# Patient Record
Sex: Female | Born: 1950
Health system: Southern US, Community
[De-identification: ages and names within clinical notes are randomized; demographics above are authoritative.]

## PROBLEM LIST (undated history)

## (undated) HISTORY — PX: ABDOMINAL HYSTERECTOMY: SHX81

## (undated) HISTORY — PX: TUBAL LIGATION: SHX77

---

## 1998-01-03 ENCOUNTER — Other Ambulatory Visit: Admission: RE | Admit: 1998-01-03 | Discharge: 1998-01-03 | Payer: Self-pay | Admitting: *Deleted

## 1998-02-20 ENCOUNTER — Other Ambulatory Visit: Admission: RE | Admit: 1998-02-20 | Discharge: 1998-02-20 | Payer: Self-pay | Admitting: *Deleted

## 1998-04-09 ENCOUNTER — Inpatient Hospital Stay (HOSPITAL_COMMUNITY): Admission: RE | Admit: 1998-04-09 | Discharge: 1998-04-10 | Payer: Self-pay | Admitting: *Deleted

## 1999-01-07 ENCOUNTER — Other Ambulatory Visit: Admission: RE | Admit: 1999-01-07 | Discharge: 1999-01-07 | Payer: Self-pay | Admitting: *Deleted

## 2000-04-15 ENCOUNTER — Other Ambulatory Visit: Admission: RE | Admit: 2000-04-15 | Discharge: 2000-04-15 | Payer: Self-pay | Admitting: *Deleted

## 2002-05-22 ENCOUNTER — Other Ambulatory Visit: Admission: RE | Admit: 2002-05-22 | Discharge: 2002-05-22 | Payer: Self-pay | Admitting: *Deleted

## 2007-01-19 ENCOUNTER — Encounter: Admission: RE | Admit: 2007-01-19 | Discharge: 2007-01-19 | Payer: Self-pay | Admitting: Internal Medicine

## 2008-02-08 ENCOUNTER — Encounter: Admission: RE | Admit: 2008-02-08 | Discharge: 2008-02-08 | Payer: Self-pay | Admitting: Internal Medicine

## 2009-02-25 ENCOUNTER — Encounter: Admission: RE | Admit: 2009-02-25 | Discharge: 2009-02-25 | Payer: Self-pay | Admitting: Internal Medicine

## 2010-03-09 ENCOUNTER — Encounter: Admission: RE | Admit: 2010-03-09 | Discharge: 2010-03-09 | Payer: Self-pay | Admitting: Internal Medicine

## 2011-02-02 ENCOUNTER — Other Ambulatory Visit: Payer: Self-pay | Admitting: Internal Medicine

## 2011-02-02 DIAGNOSIS — Z1231 Encounter for screening mammogram for malignant neoplasm of breast: Secondary | ICD-10-CM

## 2011-03-18 ENCOUNTER — Ambulatory Visit
Admission: RE | Admit: 2011-03-18 | Discharge: 2011-03-18 | Disposition: A | Discharge status: Home / Self Care | Payer: BC Managed Care – PPO | Source: Ambulatory Visit | Attending: Internal Medicine | Admitting: Internal Medicine

## 2011-03-18 DIAGNOSIS — Z1231 Encounter for screening mammogram for malignant neoplasm of breast: Secondary | ICD-10-CM

## 2012-02-09 ENCOUNTER — Other Ambulatory Visit: Payer: Self-pay | Admitting: Internal Medicine

## 2012-02-09 DIAGNOSIS — Z1231 Encounter for screening mammogram for malignant neoplasm of breast: Secondary | ICD-10-CM

## 2012-03-29 ENCOUNTER — Ambulatory Visit
Admission: RE | Admit: 2012-03-29 | Discharge: 2012-03-29 | Disposition: A | Discharge status: Home / Self Care | Payer: BC Managed Care – PPO | Source: Ambulatory Visit | Attending: Internal Medicine | Admitting: Internal Medicine

## 2012-03-29 DIAGNOSIS — Z1231 Encounter for screening mammogram for malignant neoplasm of breast: Secondary | ICD-10-CM

## 2012-12-18 ENCOUNTER — Other Ambulatory Visit: Payer: Self-pay | Admitting: Gastroenterology

## 2012-12-19 ENCOUNTER — Encounter (HOSPITAL_COMMUNITY): Payer: Self-pay | Admitting: *Deleted

## 2012-12-19 ENCOUNTER — Encounter (HOSPITAL_COMMUNITY): Payer: Self-pay | Admitting: Pharmacy Technician

## 2013-01-09 ENCOUNTER — Encounter (HOSPITAL_COMMUNITY): Payer: Self-pay | Admitting: Anesthesiology

## 2013-01-09 ENCOUNTER — Ambulatory Visit (HOSPITAL_COMMUNITY)
Admission: RE | Admit: 2013-01-09 | Discharge: 2013-01-09 | Disposition: A | Discharge status: Home / Self Care | Payer: BC Managed Care – PPO | Source: Ambulatory Visit | Attending: Gastroenterology | Admitting: Gastroenterology

## 2013-01-09 ENCOUNTER — Encounter (HOSPITAL_COMMUNITY)
Admission: RE | Disposition: A | Discharge status: Home / Self Care | Payer: Self-pay | Source: Ambulatory Visit | Attending: Gastroenterology

## 2013-01-09 ENCOUNTER — Ambulatory Visit (HOSPITAL_COMMUNITY): Payer: BC Managed Care – PPO | Admitting: Anesthesiology

## 2013-01-09 DIAGNOSIS — Z1211 Encounter for screening for malignant neoplasm of colon: Secondary | ICD-10-CM | POA: Insufficient documentation

## 2013-01-09 DIAGNOSIS — Z90710 Acquired absence of both cervix and uterus: Secondary | ICD-10-CM | POA: Insufficient documentation

## 2013-01-09 DIAGNOSIS — Z800 Family history of malignant neoplasm of digestive organs: Secondary | ICD-10-CM | POA: Insufficient documentation

## 2013-01-09 HISTORY — PX: COLONOSCOPY WITH PROPOFOL: SHX5780

## 2013-01-09 SURGERY — COLONOSCOPY WITH PROPOFOL
Anesthesia: Monitor Anesthesia Care

## 2013-01-09 MED ORDER — SODIUM CHLORIDE 0.9 % IV SOLN: INTRAVENOUS | Status: DC

## 2013-01-09 MED ORDER — PROPOFOL INFUSION 10 MG/ML OPTIME
INTRAVENOUS | Status: DC | PRN
  Administered 2013-01-09: 50 ug/kg/min via INTRAVENOUS

## 2013-01-09 MED ORDER — MIDAZOLAM HCL 5 MG/5ML IJ SOLN
INTRAMUSCULAR | Status: DC | PRN
  Administered 2013-01-09: 2 mg via INTRAVENOUS

## 2013-01-09 MED ORDER — KETAMINE HCL 50 MG/ML IJ SOLN
INTRAMUSCULAR | Status: DC | PRN
  Administered 2013-01-09: 25 mg via INTRAMUSCULAR

## 2013-01-09 MED ORDER — LACTATED RINGERS IV SOLN
INTRAVENOUS | Status: DC | PRN
  Administered 2013-01-09: 11:00:00 via INTRAVENOUS

## 2013-01-09 SURGICAL SUPPLY — 22 items

## 2013-01-09 NOTE — Anesthesia Preprocedure Evaluation (Signed)
Anesthesia Evaluation  Patient identified by MRN, date of birth, ID band Patient awake    Reviewed: Allergy & Precautions, H&P , NPO status , Patient's Chart, lab work & pertinent test results  Airway Mallampati: II TM Distance: >3 FB Neck ROM: Full    Dental no notable dental hx.    Pulmonary neg pulmonary ROS,  breath sounds clear to auscultation  Pulmonary exam normal       Cardiovascular negative cardio ROS  Rhythm:Regular Rate:Normal     Neuro/Psych negative neurological ROS  negative psych ROS   GI/Hepatic negative GI ROS, Neg liver ROS,   Endo/Other  negative endocrine ROS  Renal/GU negative Renal ROS  negative genitourinary   Musculoskeletal negative musculoskeletal ROS (+)   Abdominal   Peds negative pediatric ROS (+)  Hematology negative hematology ROS (+)   Anesthesia Other Findings   Reproductive/Obstetrics negative OB ROS                          Anesthesia Physical Anesthesia Plan  ASA: II  Anesthesia Plan: MAC   Post-op Pain Management:    Induction:   Airway Management Planned:   Additional Equipment:   Intra-op Plan:   Post-operative Plan:   Informed Consent: I have reviewed the patients History and Physical, chart, labs and discussed the procedure including the risks, benefits and alternatives for the proposed anesthesia with the patient or authorized representative who has indicated his/her understanding and acceptance.   Dental advisory given  Plan Discussed with: CRNA  Anesthesia Plan Comments:         Anesthesia Quick Evaluation  

## 2013-01-09 NOTE — Anesthesia Postprocedure Evaluation (Signed)
  Anesthesia Post-op Note  Patient: Teresa Mcdonald  Procedure(s) Performed: Procedure(s) (LRB): COLONOSCOPY WITH PROPOFOL (N/A)  Patient Location: PACU  Anesthesia Type: MAC  Level of Consciousness: awake and alert   Airway and Oxygen Therapy: Patient Spontanous Breathing  Post-op Pain: mild  Post-op Assessment: Post-op Vital signs reviewed, Patient's Cardiovascular Status Stable, Respiratory Function Stable, Patent Airway and No signs of Nausea or vomiting  Last Vitals:  Filed Vitals:   01/09/13 1200  BP: 153/79  Pulse:   Temp:   Resp: 15    Post-op Vital Signs: stable   Complications: No apparent anesthesia complications

## 2013-01-09 NOTE — Transfer of Care (Signed)
Immediate Anesthesia Transfer of Care Note  Patient: Teresa Mcdonald  Procedure(s) Performed: Procedure(s): COLONOSCOPY WITH PROPOFOL (N/A)  Patient Location: PACU  Anesthesia Type:MAC  Level of Consciousness: sedated  Airway & Oxygen Therapy: Patient Spontanous Breathing and Patient connected to nasal cannula oxygen  Post-op Assessment: Report given to PACU RN and Post -op Vital signs reviewed and stable  Post vital signs: Reviewed and stable  Complications: No apparent anesthesia complications

## 2013-01-09 NOTE — Op Note (Signed)
Procedure: Screening colonoscopy  Endoscopist: Danise Edge  Sedation: Propofol administered by anesthesia  Procedure: The patient was placed in the left lateral decubitus position. Anal inspection and digital rectal exam were normal. The Pentax pediatric colonoscope was introduced into the rectum and advanced to the cecum. A normal-appearing ileocecal valve and appendiceal orifice were identified. Colonic preparation for the exam today was good.  Rectum. Normal. Retroflex view of the distal rectum normal.  Sigmoid colon and descending colon. Normal.  Splenic flexure. Normal.  Transverse colon. Normal.  Hepatic flexure. Normal  Ascending colon. Normal.  Cecum and ileocecal valve. Normal.  Assessment:  #1. Normal screening proctocolonoscopy to the cecum  #2. Normal screening colonoscopies in 2003 and in 2009  Recommendations: Schedule repeat screening colonoscopy in 10 years

## 2013-01-09 NOTE — H&P (Signed)
Procedure: Screening colonoscopy  History: The patient is a 62 year old female born 22-Oct-1950. The patient underwent  normal screening colonoscopies in 2003 and in 2009. Her mother and father was diagnosed with colon polyps. Her maternal grandfather and maternal uncle were diagnosed with colon cancer.  The patient is scheduled to undergo a repeat screening colonoscopy.  Past medical history: Recurrent sinusitis. Vaginal hysterectomy. Tubal ligation.  Exam: The patient is alert and lying comfortably on the endoscopy stretcher. Abdomen is soft and nontender to palpation. Cardiac exam reveals a regular rhythm. Lungs are clear to auscultation.  Plan: Proceed with screening colonoscopy.

## 2013-01-10 ENCOUNTER — Encounter (HOSPITAL_COMMUNITY): Payer: Self-pay | Admitting: Gastroenterology

## 2013-04-23 ENCOUNTER — Other Ambulatory Visit: Payer: Self-pay

## 2013-04-23 DIAGNOSIS — Z1231 Encounter for screening mammogram for malignant neoplasm of breast: Secondary | ICD-10-CM

## 2013-05-11 ENCOUNTER — Ambulatory Visit
Admission: RE | Admit: 2013-05-11 | Discharge: 2013-05-11 | Disposition: A | Discharge status: Home / Self Care | Payer: Self-pay | Source: Ambulatory Visit

## 2013-05-11 DIAGNOSIS — Z1231 Encounter for screening mammogram for malignant neoplasm of breast: Secondary | ICD-10-CM

## 2014-04-30 ENCOUNTER — Other Ambulatory Visit: Payer: Self-pay

## 2014-04-30 DIAGNOSIS — Z1231 Encounter for screening mammogram for malignant neoplasm of breast: Secondary | ICD-10-CM

## 2014-05-14 ENCOUNTER — Ambulatory Visit
Admission: RE | Admit: 2014-05-14 | Discharge: 2014-05-14 | Disposition: A | Discharge status: Home / Self Care | Payer: BC Managed Care – PPO | Source: Ambulatory Visit

## 2014-05-14 ENCOUNTER — Encounter (INDEPENDENT_AMBULATORY_CARE_PROVIDER_SITE_OTHER): Payer: Self-pay

## 2014-05-14 DIAGNOSIS — Z1231 Encounter for screening mammogram for malignant neoplasm of breast: Secondary | ICD-10-CM

## 2015-05-01 ENCOUNTER — Other Ambulatory Visit: Payer: Self-pay

## 2015-05-01 DIAGNOSIS — Z1231 Encounter for screening mammogram for malignant neoplasm of breast: Secondary | ICD-10-CM

## 2015-05-20 ENCOUNTER — Ambulatory Visit
Admission: RE | Admit: 2015-05-20 | Discharge: 2015-05-20 | Disposition: A | Discharge status: Home / Self Care | Payer: BC Managed Care – PPO | Source: Ambulatory Visit

## 2015-05-20 DIAGNOSIS — Z1231 Encounter for screening mammogram for malignant neoplasm of breast: Secondary | ICD-10-CM

## 2015-05-22 ENCOUNTER — Other Ambulatory Visit: Payer: Self-pay | Admitting: Family Medicine

## 2015-05-22 DIAGNOSIS — R928 Other abnormal and inconclusive findings on diagnostic imaging of breast: Secondary | ICD-10-CM

## 2015-05-27 ENCOUNTER — Ambulatory Visit
Admission: RE | Admit: 2015-05-27 | Discharge: 2015-05-27 | Disposition: A | Discharge status: Home / Self Care | Payer: BC Managed Care – PPO | Source: Ambulatory Visit | Attending: Family Medicine | Admitting: Family Medicine

## 2015-05-27 DIAGNOSIS — R928 Other abnormal and inconclusive findings on diagnostic imaging of breast: Secondary | ICD-10-CM

## 2016-01-30 ENCOUNTER — Other Ambulatory Visit: Payer: Self-pay | Admitting: Obstetrics and Gynecology

## 2016-01-30 DIAGNOSIS — M8580 Other specified disorders of bone density and structure, unspecified site: Secondary | ICD-10-CM

## 2016-02-11 ENCOUNTER — Ambulatory Visit
Admission: RE | Admit: 2016-02-11 | Discharge: 2016-02-11 | Disposition: A | Discharge status: Home / Self Care | Payer: BC Managed Care – PPO | Source: Ambulatory Visit | Attending: Obstetrics and Gynecology | Admitting: Obstetrics and Gynecology

## 2016-02-11 DIAGNOSIS — M8580 Other specified disorders of bone density and structure, unspecified site: Secondary | ICD-10-CM

## 2017-03-23 ENCOUNTER — Other Ambulatory Visit: Payer: Self-pay | Admitting: Obstetrics and Gynecology

## 2017-03-23 DIAGNOSIS — Z1231 Encounter for screening mammogram for malignant neoplasm of breast: Secondary | ICD-10-CM

## 2017-04-22 ENCOUNTER — Ambulatory Visit
Admission: RE | Admit: 2017-04-22 | Discharge: 2017-04-22 | Disposition: A | Discharge status: Home / Self Care | Payer: Medicare Other | Source: Ambulatory Visit | Attending: Obstetrics and Gynecology | Admitting: Obstetrics and Gynecology

## 2017-04-22 DIAGNOSIS — Z1231 Encounter for screening mammogram for malignant neoplasm of breast: Secondary | ICD-10-CM

## 2018-03-15 ENCOUNTER — Other Ambulatory Visit: Payer: Self-pay | Admitting: Obstetrics and Gynecology

## 2018-03-15 DIAGNOSIS — Z1231 Encounter for screening mammogram for malignant neoplasm of breast: Secondary | ICD-10-CM

## 2018-04-25 ENCOUNTER — Ambulatory Visit
Admission: RE | Admit: 2018-04-25 | Discharge: 2018-04-25 | Disposition: A | Discharge status: Home / Self Care | Payer: Medicare Other | Source: Ambulatory Visit | Attending: Obstetrics and Gynecology | Admitting: Obstetrics and Gynecology

## 2018-04-25 DIAGNOSIS — Z1231 Encounter for screening mammogram for malignant neoplasm of breast: Secondary | ICD-10-CM

## 2019-03-15 ENCOUNTER — Other Ambulatory Visit: Payer: Self-pay | Admitting: Obstetrics and Gynecology

## 2019-03-15 DIAGNOSIS — Z1231 Encounter for screening mammogram for malignant neoplasm of breast: Secondary | ICD-10-CM

## 2019-05-08 ENCOUNTER — Ambulatory Visit: Payer: Medicare Other

## 2019-05-22 ENCOUNTER — Ambulatory Visit: Payer: Self-pay | Attending: Internal Medicine

## 2019-05-22 DIAGNOSIS — Z23 Encounter for immunization: Secondary | ICD-10-CM | POA: Insufficient documentation

## 2019-05-22 NOTE — Progress Notes (Signed)
Covid-19 Vaccination Clinic  Name:  Teresa Mcdonald    MRN: [id number] DOB: Feb 13, 1951  05/22/2019  Ms. Teresa Mcdonald was observed post Covid-19 immunization for 15 minutes without incidence. She was provided with Vaccine Information Sheet and instruction to access the V-Safe system.   Ms. Teresa Mcdonald was instructed to call 911 with any severe reactions post vaccine: Marland Kitchen Difficulty breathing  . Swelling of your face and throat  . A fast heartbeat  . A bad rash all over your body  . Dizziness and weakness    Immunizations Administered    Name Date Dose VIS Date Route   Pfizer COVID-19 Vaccine 05/22/2019  8:27 AM 0.3 mL 03/23/2019 Intramuscular   Manufacturer: [employer], [employer]   Lot: [id number]   NDC: [address]-9

## 2019-05-24 ENCOUNTER — Ambulatory Visit: Payer: Medicare Other

## 2019-06-13 DIAGNOSIS — Z01419 Encounter for gynecological examination (general) (routine) without abnormal findings: Secondary | ICD-10-CM | POA: Diagnosis not present

## 2019-06-14 ENCOUNTER — Other Ambulatory Visit: Payer: Self-pay | Admitting: Obstetrics and Gynecology

## 2019-06-14 DIAGNOSIS — E2839 Other primary ovarian failure: Secondary | ICD-10-CM

## 2019-06-16 ENCOUNTER — Ambulatory Visit: Payer: Medicare PPO | Attending: Internal Medicine

## 2019-06-16 DIAGNOSIS — Z23 Encounter for immunization: Secondary | ICD-10-CM

## 2019-06-16 NOTE — Progress Notes (Signed)
Covid-19 Vaccination Clinic  Name:  [name]    MRN: [id number] DOB: 1950/10/08  06/16/2019  [name] was observed post Covid-19 immunization for 15 minutes without incident. She was provided with Vaccine Information Sheet and instruction to access the V-Safe system.   [name] was instructed to call 911 with any severe reactions post vaccine: Marland Kitchen Difficulty breathing  . Swelling of face and throat  . A fast heartbeat  . A bad rash all over body  . Dizziness and weakness   Immunizations Administered    Name Date Dose VIS Date Route   Pfizer COVID-19 Vaccine 06/16/2019  9:09 AM 0.3 mL 03/23/2019 Intramuscular   Manufacturer: [employer], [employer]   Lot: [id number]   NDC: [address]-2

## 2019-07-11 DIAGNOSIS — Z1211 Encounter for screening for malignant neoplasm of colon: Secondary | ICD-10-CM | POA: Diagnosis not present

## 2019-07-30 DIAGNOSIS — Z1231 Encounter for screening mammogram for malignant neoplasm of breast: Secondary | ICD-10-CM | POA: Diagnosis not present

## 2019-08-14 ENCOUNTER — Ambulatory Visit
Admission: RE | Admit: 2019-08-14 | Discharge: 2019-08-14 | Disposition: A | Discharge status: Home / Self Care | Payer: Medicare PPO | Source: Ambulatory Visit | Attending: Obstetrics and Gynecology | Admitting: Obstetrics and Gynecology

## 2019-08-14 ENCOUNTER — Other Ambulatory Visit: Payer: Self-pay

## 2019-08-14 DIAGNOSIS — M8589 Other specified disorders of bone density and structure, multiple sites: Secondary | ICD-10-CM | POA: Diagnosis not present

## 2019-08-14 DIAGNOSIS — E2839 Other primary ovarian failure: Secondary | ICD-10-CM

## 2019-08-14 DIAGNOSIS — Z780 Asymptomatic menopausal state: Secondary | ICD-10-CM | POA: Diagnosis not present

## 2019-08-21 DIAGNOSIS — R195 Other fecal abnormalities: Secondary | ICD-10-CM | POA: Diagnosis not present

## 2019-08-21 DIAGNOSIS — Z800 Family history of malignant neoplasm of digestive organs: Secondary | ICD-10-CM | POA: Diagnosis not present

## 2019-08-21 DIAGNOSIS — K625 Hemorrhage of anus and rectum: Secondary | ICD-10-CM | POA: Diagnosis not present

## 2019-09-17 DIAGNOSIS — Z03818 Encounter for observation for suspected exposure to other biological agents ruled out: Secondary | ICD-10-CM | POA: Diagnosis not present

## 2019-09-20 DIAGNOSIS — R195 Other fecal abnormalities: Secondary | ICD-10-CM | POA: Diagnosis not present

## 2019-12-06 DIAGNOSIS — Z20822 Contact with and (suspected) exposure to covid-19: Secondary | ICD-10-CM | POA: Diagnosis not present

## 2020-01-31 DIAGNOSIS — Z23 Encounter for immunization: Secondary | ICD-10-CM | POA: Diagnosis not present

## 2020-02-13 DIAGNOSIS — Z780 Asymptomatic menopausal state: Secondary | ICD-10-CM | POA: Diagnosis not present

## 2020-02-13 DIAGNOSIS — M8580 Other specified disorders of bone density and structure, unspecified site: Secondary | ICD-10-CM | POA: Diagnosis not present

## 2020-05-13 DIAGNOSIS — Z862 Personal history of diseases of the blood and blood-forming organs and certain disorders involving the immune mechanism: Secondary | ICD-10-CM | POA: Diagnosis not present

## 2020-05-13 DIAGNOSIS — Z136 Encounter for screening for cardiovascular disorders: Secondary | ICD-10-CM | POA: Diagnosis not present

## 2020-05-13 DIAGNOSIS — Z13220 Encounter for screening for lipoid disorders: Secondary | ICD-10-CM | POA: Diagnosis not present

## 2020-05-13 DIAGNOSIS — Z0000 Encounter for general adult medical examination without abnormal findings: Secondary | ICD-10-CM | POA: Diagnosis not present

## 2020-05-13 DIAGNOSIS — M8589 Other specified disorders of bone density and structure, multiple sites: Secondary | ICD-10-CM | POA: Diagnosis not present

## 2020-05-13 DIAGNOSIS — Z23 Encounter for immunization: Secondary | ICD-10-CM | POA: Diagnosis not present

## 2020-06-20 DIAGNOSIS — M8580 Other specified disorders of bone density and structure, unspecified site: Secondary | ICD-10-CM | POA: Diagnosis not present

## 2020-06-20 DIAGNOSIS — Z780 Asymptomatic menopausal state: Secondary | ICD-10-CM | POA: Diagnosis not present

## 2020-07-29 DIAGNOSIS — H43312 Vitreous membranes and strands, left eye: Secondary | ICD-10-CM | POA: Diagnosis not present

## 2020-07-29 DIAGNOSIS — H524 Presbyopia: Secondary | ICD-10-CM | POA: Diagnosis not present

## 2020-07-29 DIAGNOSIS — H04123 Dry eye syndrome of bilateral lacrimal glands: Secondary | ICD-10-CM | POA: Diagnosis not present

## 2020-10-23 ENCOUNTER — Other Ambulatory Visit: Payer: Self-pay | Admitting: Obstetrics and Gynecology

## 2020-10-23 DIAGNOSIS — Z1231 Encounter for screening mammogram for malignant neoplasm of breast: Secondary | ICD-10-CM

## 2020-12-12 DIAGNOSIS — Z01419 Encounter for gynecological examination (general) (routine) without abnormal findings: Secondary | ICD-10-CM | POA: Diagnosis not present

## 2020-12-18 ENCOUNTER — Other Ambulatory Visit: Payer: Self-pay

## 2020-12-18 ENCOUNTER — Ambulatory Visit
Admission: RE | Admit: 2020-12-18 | Discharge: 2020-12-18 | Disposition: A | Discharge status: Home / Self Care | Payer: Medicare PPO | Source: Ambulatory Visit | Attending: Obstetrics and Gynecology | Admitting: Obstetrics and Gynecology

## 2020-12-18 DIAGNOSIS — Z1231 Encounter for screening mammogram for malignant neoplasm of breast: Secondary | ICD-10-CM | POA: Diagnosis not present

## 2021-01-22 DIAGNOSIS — Z23 Encounter for immunization: Secondary | ICD-10-CM | POA: Diagnosis not present

## 2021-02-03 DIAGNOSIS — M8589 Other specified disorders of bone density and structure, multiple sites: Secondary | ICD-10-CM | POA: Diagnosis not present

## 2021-02-03 DIAGNOSIS — Z780 Asymptomatic menopausal state: Secondary | ICD-10-CM | POA: Diagnosis not present

## 2021-03-13 ENCOUNTER — Other Ambulatory Visit (HOSPITAL_COMMUNITY): Payer: Self-pay | Admitting: Medical

## 2021-03-13 ENCOUNTER — Other Ambulatory Visit: Payer: Self-pay | Admitting: Medical

## 2021-03-13 ENCOUNTER — Other Ambulatory Visit: Payer: Self-pay

## 2021-03-13 ENCOUNTER — Ambulatory Visit (HOSPITAL_COMMUNITY)
Admission: RE | Admit: 2021-03-13 | Discharge: 2021-03-13 | Disposition: A | Discharge status: Home / Self Care | Payer: Medicare PPO | Source: Ambulatory Visit | Attending: Medical | Admitting: Medical

## 2021-03-13 DIAGNOSIS — M79604 Pain in right leg: Secondary | ICD-10-CM

## 2021-03-13 DIAGNOSIS — M7989 Other specified soft tissue disorders: Secondary | ICD-10-CM | POA: Diagnosis not present

## 2021-03-13 DIAGNOSIS — S82121A Displaced fracture of lateral condyle of right tibia, initial encounter for closed fracture: Secondary | ICD-10-CM | POA: Diagnosis not present

## 2021-03-13 DIAGNOSIS — M1711 Unilateral primary osteoarthritis, right knee: Secondary | ICD-10-CM | POA: Diagnosis not present

## 2021-03-13 DIAGNOSIS — S82144A Nondisplaced bicondylar fracture of right tibia, initial encounter for closed fracture: Secondary | ICD-10-CM | POA: Diagnosis not present

## 2021-03-13 DIAGNOSIS — S8001XA Contusion of right knee, initial encounter: Secondary | ICD-10-CM | POA: Diagnosis not present

## 2021-03-17 DIAGNOSIS — M79661 Pain in right lower leg: Secondary | ICD-10-CM | POA: Diagnosis not present

## 2021-03-17 DIAGNOSIS — S82144A Nondisplaced bicondylar fracture of right tibia, initial encounter for closed fracture: Secondary | ICD-10-CM | POA: Diagnosis not present

## 2021-03-23 ENCOUNTER — Other Ambulatory Visit (HOSPITAL_BASED_OUTPATIENT_CLINIC_OR_DEPARTMENT_OTHER): Payer: Self-pay

## 2021-03-31 DIAGNOSIS — S82141D Displaced bicondylar fracture of right tibia, subsequent encounter for closed fracture with routine healing: Secondary | ICD-10-CM | POA: Diagnosis not present

## 2021-04-08 DIAGNOSIS — M25561 Pain in right knee: Secondary | ICD-10-CM | POA: Diagnosis not present

## 2021-04-08 DIAGNOSIS — M25661 Stiffness of right knee, not elsewhere classified: Secondary | ICD-10-CM | POA: Diagnosis not present

## 2021-04-08 DIAGNOSIS — S82144S Nondisplaced bicondylar fracture of right tibia, sequela: Secondary | ICD-10-CM | POA: Diagnosis not present

## 2021-04-08 DIAGNOSIS — R262 Difficulty in walking, not elsewhere classified: Secondary | ICD-10-CM | POA: Diagnosis not present

## 2021-04-08 DIAGNOSIS — R531 Weakness: Secondary | ICD-10-CM | POA: Diagnosis not present

## 2021-04-10 DIAGNOSIS — R262 Difficulty in walking, not elsewhere classified: Secondary | ICD-10-CM | POA: Diagnosis not present

## 2021-04-10 DIAGNOSIS — M25561 Pain in right knee: Secondary | ICD-10-CM | POA: Diagnosis not present

## 2021-04-10 DIAGNOSIS — M25661 Stiffness of right knee, not elsewhere classified: Secondary | ICD-10-CM | POA: Diagnosis not present

## 2021-04-10 DIAGNOSIS — R531 Weakness: Secondary | ICD-10-CM | POA: Diagnosis not present

## 2021-04-10 DIAGNOSIS — S82144S Nondisplaced bicondylar fracture of right tibia, sequela: Secondary | ICD-10-CM | POA: Diagnosis not present

## 2021-04-14 DIAGNOSIS — M25561 Pain in right knee: Secondary | ICD-10-CM | POA: Diagnosis not present

## 2021-04-14 DIAGNOSIS — M25661 Stiffness of right knee, not elsewhere classified: Secondary | ICD-10-CM | POA: Diagnosis not present

## 2021-04-14 DIAGNOSIS — S82144S Nondisplaced bicondylar fracture of right tibia, sequela: Secondary | ICD-10-CM | POA: Diagnosis not present

## 2021-04-14 DIAGNOSIS — R531 Weakness: Secondary | ICD-10-CM | POA: Diagnosis not present

## 2021-04-14 DIAGNOSIS — R262 Difficulty in walking, not elsewhere classified: Secondary | ICD-10-CM | POA: Diagnosis not present

## 2021-04-16 DIAGNOSIS — M25661 Stiffness of right knee, not elsewhere classified: Secondary | ICD-10-CM | POA: Diagnosis not present

## 2021-04-16 DIAGNOSIS — R531 Weakness: Secondary | ICD-10-CM | POA: Diagnosis not present

## 2021-04-16 DIAGNOSIS — M25561 Pain in right knee: Secondary | ICD-10-CM | POA: Diagnosis not present

## 2021-04-16 DIAGNOSIS — R262 Difficulty in walking, not elsewhere classified: Secondary | ICD-10-CM | POA: Diagnosis not present

## 2021-04-16 DIAGNOSIS — S82144S Nondisplaced bicondylar fracture of right tibia, sequela: Secondary | ICD-10-CM | POA: Diagnosis not present

## 2021-04-21 DIAGNOSIS — R531 Weakness: Secondary | ICD-10-CM | POA: Diagnosis not present

## 2021-04-21 DIAGNOSIS — R262 Difficulty in walking, not elsewhere classified: Secondary | ICD-10-CM | POA: Diagnosis not present

## 2021-04-21 DIAGNOSIS — M25561 Pain in right knee: Secondary | ICD-10-CM | POA: Diagnosis not present

## 2021-04-21 DIAGNOSIS — S82144S Nondisplaced bicondylar fracture of right tibia, sequela: Secondary | ICD-10-CM | POA: Diagnosis not present

## 2021-04-21 DIAGNOSIS — M25661 Stiffness of right knee, not elsewhere classified: Secondary | ICD-10-CM | POA: Diagnosis not present

## 2021-04-23 DIAGNOSIS — M25561 Pain in right knee: Secondary | ICD-10-CM | POA: Diagnosis not present

## 2021-04-23 DIAGNOSIS — S82144S Nondisplaced bicondylar fracture of right tibia, sequela: Secondary | ICD-10-CM | POA: Diagnosis not present

## 2021-04-23 DIAGNOSIS — R262 Difficulty in walking, not elsewhere classified: Secondary | ICD-10-CM | POA: Diagnosis not present

## 2021-04-23 DIAGNOSIS — M25661 Stiffness of right knee, not elsewhere classified: Secondary | ICD-10-CM | POA: Diagnosis not present

## 2021-04-23 DIAGNOSIS — R531 Weakness: Secondary | ICD-10-CM | POA: Diagnosis not present

## 2021-04-27 DIAGNOSIS — M25561 Pain in right knee: Secondary | ICD-10-CM | POA: Diagnosis not present

## 2021-04-27 DIAGNOSIS — M25661 Stiffness of right knee, not elsewhere classified: Secondary | ICD-10-CM | POA: Diagnosis not present

## 2021-04-27 DIAGNOSIS — R531 Weakness: Secondary | ICD-10-CM | POA: Diagnosis not present

## 2021-04-27 DIAGNOSIS — R262 Difficulty in walking, not elsewhere classified: Secondary | ICD-10-CM | POA: Diagnosis not present

## 2021-04-27 DIAGNOSIS — S82144S Nondisplaced bicondylar fracture of right tibia, sequela: Secondary | ICD-10-CM | POA: Diagnosis not present

## 2021-04-28 DIAGNOSIS — S82144S Nondisplaced bicondylar fracture of right tibia, sequela: Secondary | ICD-10-CM | POA: Diagnosis not present

## 2021-05-01 DIAGNOSIS — S82144S Nondisplaced bicondylar fracture of right tibia, sequela: Secondary | ICD-10-CM | POA: Diagnosis not present

## 2021-05-01 DIAGNOSIS — R262 Difficulty in walking, not elsewhere classified: Secondary | ICD-10-CM | POA: Diagnosis not present

## 2021-05-01 DIAGNOSIS — R531 Weakness: Secondary | ICD-10-CM | POA: Diagnosis not present

## 2021-05-01 DIAGNOSIS — M25561 Pain in right knee: Secondary | ICD-10-CM | POA: Diagnosis not present

## 2021-05-01 DIAGNOSIS — M25661 Stiffness of right knee, not elsewhere classified: Secondary | ICD-10-CM | POA: Diagnosis not present

## 2021-05-05 DIAGNOSIS — M25561 Pain in right knee: Secondary | ICD-10-CM | POA: Diagnosis not present

## 2021-05-05 DIAGNOSIS — R262 Difficulty in walking, not elsewhere classified: Secondary | ICD-10-CM | POA: Diagnosis not present

## 2021-05-05 DIAGNOSIS — M25661 Stiffness of right knee, not elsewhere classified: Secondary | ICD-10-CM | POA: Diagnosis not present

## 2021-05-05 DIAGNOSIS — S82144S Nondisplaced bicondylar fracture of right tibia, sequela: Secondary | ICD-10-CM | POA: Diagnosis not present

## 2021-05-05 DIAGNOSIS — R531 Weakness: Secondary | ICD-10-CM | POA: Diagnosis not present

## 2021-05-12 DIAGNOSIS — M25561 Pain in right knee: Secondary | ICD-10-CM | POA: Diagnosis not present

## 2021-05-12 DIAGNOSIS — S82144S Nondisplaced bicondylar fracture of right tibia, sequela: Secondary | ICD-10-CM | POA: Diagnosis not present

## 2021-05-12 DIAGNOSIS — R531 Weakness: Secondary | ICD-10-CM | POA: Diagnosis not present

## 2021-05-12 DIAGNOSIS — R262 Difficulty in walking, not elsewhere classified: Secondary | ICD-10-CM | POA: Diagnosis not present

## 2021-05-12 DIAGNOSIS — M25661 Stiffness of right knee, not elsewhere classified: Secondary | ICD-10-CM | POA: Diagnosis not present

## 2021-05-13 DIAGNOSIS — M810 Age-related osteoporosis without current pathological fracture: Secondary | ICD-10-CM | POA: Diagnosis not present

## 2021-05-13 DIAGNOSIS — Z780 Asymptomatic menopausal state: Secondary | ICD-10-CM | POA: Diagnosis not present

## 2021-05-13 DIAGNOSIS — M8580 Other specified disorders of bone density and structure, unspecified site: Secondary | ICD-10-CM | POA: Diagnosis not present

## 2021-05-14 DIAGNOSIS — M25561 Pain in right knee: Secondary | ICD-10-CM | POA: Diagnosis not present

## 2021-05-14 DIAGNOSIS — S82144S Nondisplaced bicondylar fracture of right tibia, sequela: Secondary | ICD-10-CM | POA: Diagnosis not present

## 2021-05-14 DIAGNOSIS — M25661 Stiffness of right knee, not elsewhere classified: Secondary | ICD-10-CM | POA: Diagnosis not present

## 2021-05-14 DIAGNOSIS — R262 Difficulty in walking, not elsewhere classified: Secondary | ICD-10-CM | POA: Diagnosis not present

## 2021-05-14 DIAGNOSIS — R531 Weakness: Secondary | ICD-10-CM | POA: Diagnosis not present

## 2021-05-19 DIAGNOSIS — S82141S Displaced bicondylar fracture of right tibia, sequela: Secondary | ICD-10-CM | POA: Diagnosis not present

## 2021-05-19 DIAGNOSIS — Z13220 Encounter for screening for lipoid disorders: Secondary | ICD-10-CM | POA: Diagnosis not present

## 2021-05-19 DIAGNOSIS — M25561 Pain in right knee: Secondary | ICD-10-CM | POA: Diagnosis not present

## 2021-05-19 DIAGNOSIS — Z1231 Encounter for screening mammogram for malignant neoplasm of breast: Secondary | ICD-10-CM | POA: Diagnosis not present

## 2021-05-19 DIAGNOSIS — S82144S Nondisplaced bicondylar fracture of right tibia, sequela: Secondary | ICD-10-CM | POA: Diagnosis not present

## 2021-05-19 DIAGNOSIS — Z0000 Encounter for general adult medical examination without abnormal findings: Secondary | ICD-10-CM | POA: Diagnosis not present

## 2021-05-19 DIAGNOSIS — J309 Allergic rhinitis, unspecified: Secondary | ICD-10-CM | POA: Diagnosis not present

## 2021-05-19 DIAGNOSIS — M8588 Other specified disorders of bone density and structure, other site: Secondary | ICD-10-CM | POA: Diagnosis not present

## 2021-05-19 DIAGNOSIS — R8290 Unspecified abnormal findings in urine: Secondary | ICD-10-CM | POA: Diagnosis not present

## 2021-05-19 DIAGNOSIS — R262 Difficulty in walking, not elsewhere classified: Secondary | ICD-10-CM | POA: Diagnosis not present

## 2021-05-19 DIAGNOSIS — Z1211 Encounter for screening for malignant neoplasm of colon: Secondary | ICD-10-CM | POA: Diagnosis not present

## 2021-05-19 DIAGNOSIS — R531 Weakness: Secondary | ICD-10-CM | POA: Diagnosis not present

## 2021-05-19 DIAGNOSIS — M25661 Stiffness of right knee, not elsewhere classified: Secondary | ICD-10-CM | POA: Diagnosis not present

## 2021-05-28 DIAGNOSIS — R531 Weakness: Secondary | ICD-10-CM | POA: Diagnosis not present

## 2021-05-28 DIAGNOSIS — M25661 Stiffness of right knee, not elsewhere classified: Secondary | ICD-10-CM | POA: Diagnosis not present

## 2021-05-28 DIAGNOSIS — M25561 Pain in right knee: Secondary | ICD-10-CM | POA: Diagnosis not present

## 2021-05-28 DIAGNOSIS — S82144S Nondisplaced bicondylar fracture of right tibia, sequela: Secondary | ICD-10-CM | POA: Diagnosis not present

## 2021-05-28 DIAGNOSIS — R262 Difficulty in walking, not elsewhere classified: Secondary | ICD-10-CM | POA: Diagnosis not present

## 2021-05-29 DIAGNOSIS — S82144S Nondisplaced bicondylar fracture of right tibia, sequela: Secondary | ICD-10-CM | POA: Diagnosis not present

## 2021-07-13 ENCOUNTER — Ambulatory Visit: Payer: Medicare PPO | Admitting: Dermatology

## 2021-07-13 ENCOUNTER — Encounter: Payer: Self-pay | Admitting: Dermatology

## 2021-07-13 DIAGNOSIS — L570 Actinic keratosis: Secondary | ICD-10-CM | POA: Diagnosis not present

## 2021-07-13 DIAGNOSIS — Z1283 Encounter for screening for malignant neoplasm of skin: Secondary | ICD-10-CM

## 2021-07-13 DIAGNOSIS — R238 Other skin changes: Secondary | ICD-10-CM

## 2021-07-13 DIAGNOSIS — D1800 Hemangioma unspecified site: Secondary | ICD-10-CM

## 2021-08-02 ENCOUNTER — Encounter: Payer: Self-pay | Admitting: Dermatology

## 2021-08-02 NOTE — Progress Notes (Signed)
? ?  Follow-Up Visit ?  ?Subjective  ?Teresa Mcdonald is a 71 y.o. female who presents for the following: Annual Exam (Annual skin check. Lesion on hands. ). ? ?Annual skin examination, check spot hand ?Location:  ?Duration:  ?Quality:  ?Associated Signs/Symptoms: ?Modifying Factors:  ?Severity:  ?Timing: ?Context:  ? ?Objective  ?Well appearing patient in no apparent distress; mood and affect are within normal limits. ?Full body skin examination: No atypical pigmented lesions (all checked with dermoscopy), no current nonmelanoma skin cancer. ? ?Left Lower Vermilion Lip ?Soft Blue partially compressible 4 mm dermal papule ? ?Left Dorsal Hand ?Horn like 6 mm pink crusts ? ?Right Upper Back ?Smooth red 3 mm dermal papule ? ? ? ?A full examination was performed including scalp, head, eyes, ears, nose, lips, neck, chest, axillae, abdomen, back, buttocks, bilateral upper extremities, bilateral lower extremities, hands, feet, fingers, toes, fingernails, and toenails. All findings within normal limits unless otherwise noted below.  Is beneath undergarments not fully examined. ? ? ?Assessment & Plan  ? ? ?Venous lake of lip ?Left Lower Vermilion Lip ? ?Historically stable, check.  as needed change ? ?AK (actinic keratosis) ?Left Dorsal Hand ? ? ? ? ?Destruction of lesion - Left Dorsal Hand ?Complexity: simple   ?Destruction method: cryotherapy   ?Informed consent: discussed and consent obtained   ?Timeout:  patient name, date of birth, surgical site, and procedure verified ?Lesion destroyed using liquid nitrogen: Yes   ?Cryotherapy cycles:  3 ?Outcome: patient tolerated procedure well with no complications   ? ?Encounter for screening for malignant neoplasm of skin ? ?Annual skin examination. ? ?Hemangioma, unspecified site ?Right Upper Back ? ?No intervention indicated ? ? ? ? ? ?I, Lavonna Monarch, MD, have reviewed all documentation for this visit.  The documentation on 08/02/21 for the exam, diagnosis, procedures, and  orders are all accurate and complete. ?

## 2021-09-28 DIAGNOSIS — M8950 Osteolysis, unspecified site: Secondary | ICD-10-CM | POA: Diagnosis not present

## 2021-09-28 DIAGNOSIS — E559 Vitamin D deficiency, unspecified: Secondary | ICD-10-CM | POA: Diagnosis not present

## 2021-12-16 ENCOUNTER — Other Ambulatory Visit: Payer: Self-pay | Admitting: Family Medicine

## 2021-12-16 DIAGNOSIS — Z139 Encounter for screening, unspecified: Secondary | ICD-10-CM

## 2021-12-23 ENCOUNTER — Ambulatory Visit
Admission: RE | Admit: 2021-12-23 | Discharge: 2021-12-23 | Disposition: A | Discharge status: Home / Self Care | Payer: Medicare PPO | Source: Ambulatory Visit | Attending: Family Medicine | Admitting: Family Medicine

## 2021-12-23 DIAGNOSIS — Z139 Encounter for screening, unspecified: Secondary | ICD-10-CM

## 2021-12-23 DIAGNOSIS — Z1231 Encounter for screening mammogram for malignant neoplasm of breast: Secondary | ICD-10-CM | POA: Diagnosis not present

## 2022-02-02 DIAGNOSIS — Z23 Encounter for immunization: Secondary | ICD-10-CM | POA: Diagnosis not present

## 2022-05-14 DIAGNOSIS — Z780 Asymptomatic menopausal state: Secondary | ICD-10-CM | POA: Diagnosis not present

## 2022-05-14 DIAGNOSIS — M8580 Other specified disorders of bone density and structure, unspecified site: Secondary | ICD-10-CM | POA: Diagnosis not present

## 2022-05-18 DIAGNOSIS — Z1211 Encounter for screening for malignant neoplasm of colon: Secondary | ICD-10-CM | POA: Diagnosis not present

## 2022-05-18 DIAGNOSIS — Z1231 Encounter for screening mammogram for malignant neoplasm of breast: Secondary | ICD-10-CM | POA: Diagnosis not present

## 2022-05-18 DIAGNOSIS — Z800 Family history of malignant neoplasm of digestive organs: Secondary | ICD-10-CM | POA: Diagnosis not present

## 2022-05-18 DIAGNOSIS — M8580 Other specified disorders of bone density and structure, unspecified site: Secondary | ICD-10-CM | POA: Diagnosis not present

## 2022-05-18 DIAGNOSIS — Z0000 Encounter for general adult medical examination without abnormal findings: Secondary | ICD-10-CM | POA: Diagnosis not present

## 2022-05-18 DIAGNOSIS — Z136 Encounter for screening for cardiovascular disorders: Secondary | ICD-10-CM | POA: Diagnosis not present

## 2022-06-24 IMAGING — CT CT 3D ACQUISTION WKST
2 of 6 series · 15 of 46 positions shown, 17 images · non-contrast
Comparison: None.

CLINICAL DATA: Right knee pain status post fall. Pain and swelling.
Nonspecific (abnormal) findings on radiological and other
examination of musculoskeletal system.

EXAM:
CT OF THE RIGHT KNEE WITHOUT CONTRAST
CT - 3-DIMENSIONAL CT IMAGE RENDERING ON ACQUISITION WORKSTATION
TECHNIQUE: Multidetector CT imaging of the RIGHT knee was performed according
to the standard protocol. Multiplanar CT image reconstructions were
also generated.

[Series 6: axial st · axial · 0.49mm/px · z∈[-958,-722]mm · 12 of 183 slices shown, 14 images]
[im 13/183  soft-tissue]
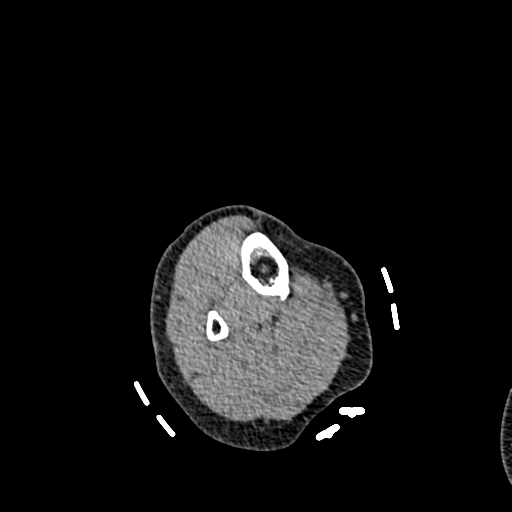
[im 13/183  bone]
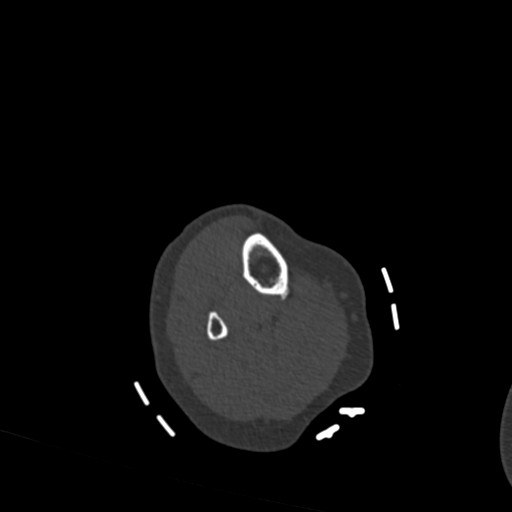
[im 25/183  soft-tissue]
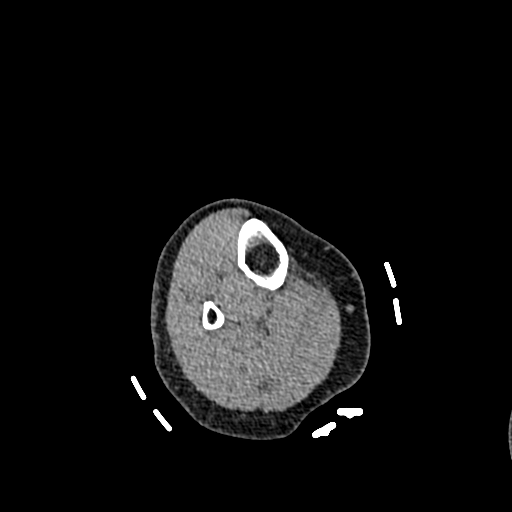
[im 37/183  soft-tissue]
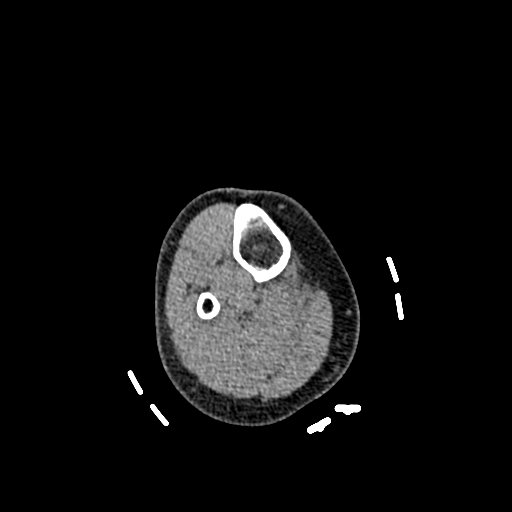
[im 61/183  soft-tissue]
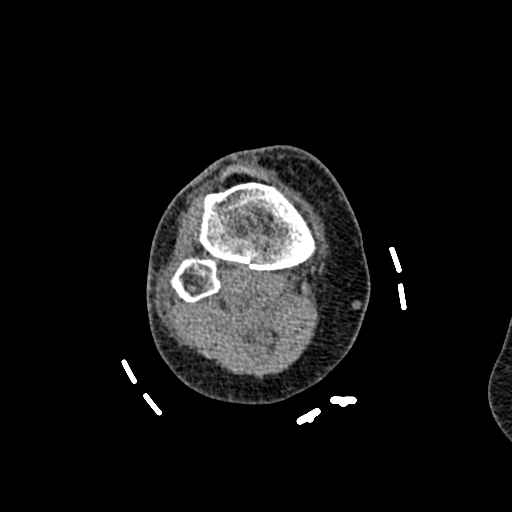
[im 73/183  soft-tissue]
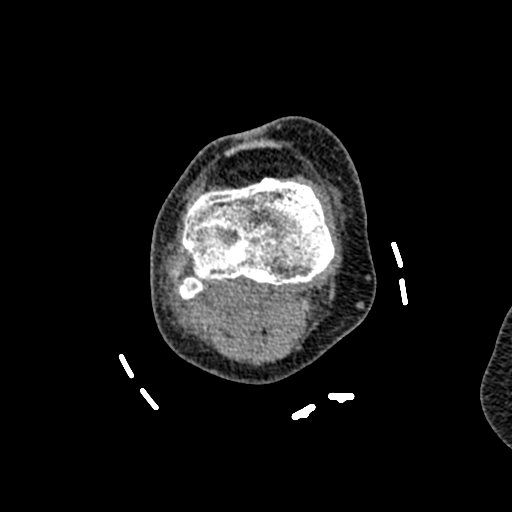
[im 85/183  soft-tissue]
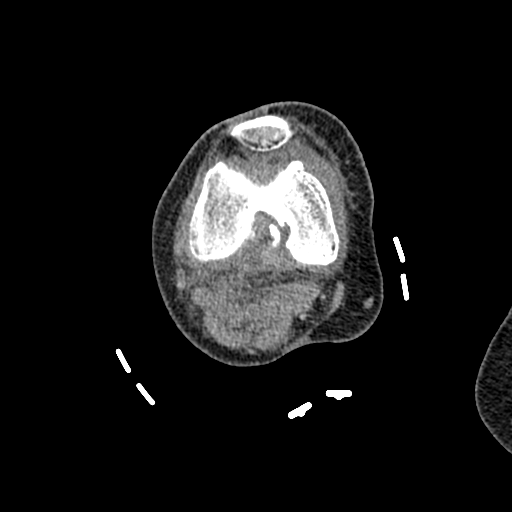
[im 98/183  soft-tissue]
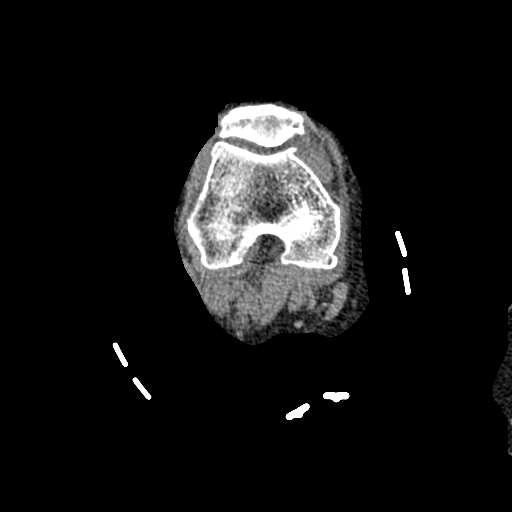
[im 110/183  soft-tissue]
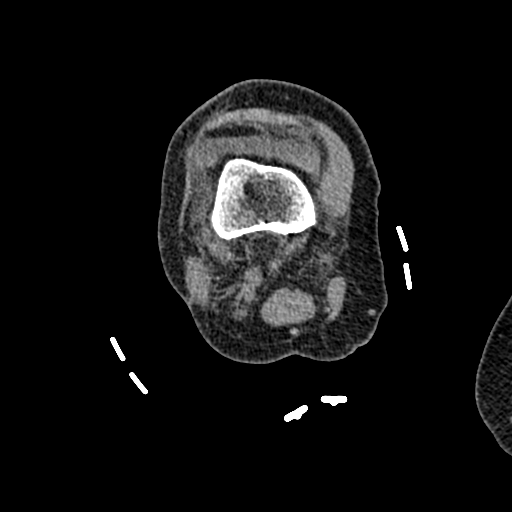
[im 122/183  soft-tissue]
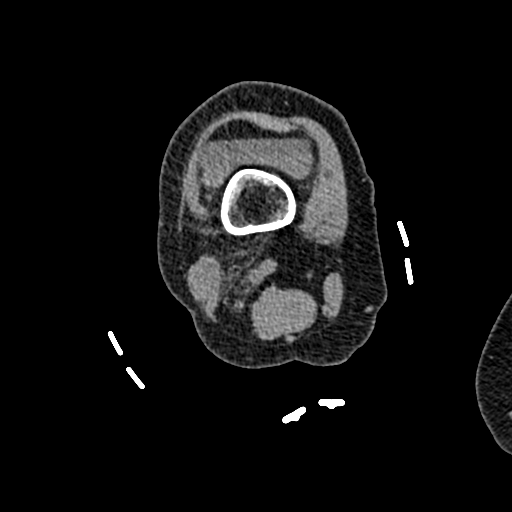
[im 122/183  bone]
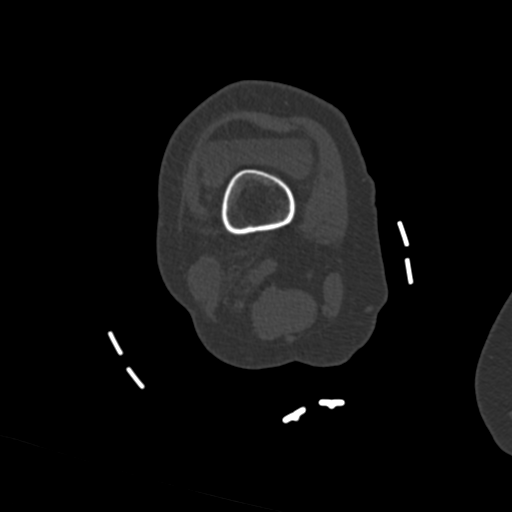
[im 146/183  soft-tissue]
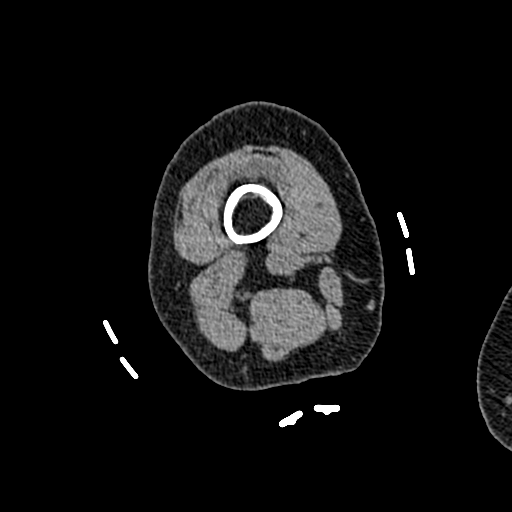
[im 158/183  soft-tissue]
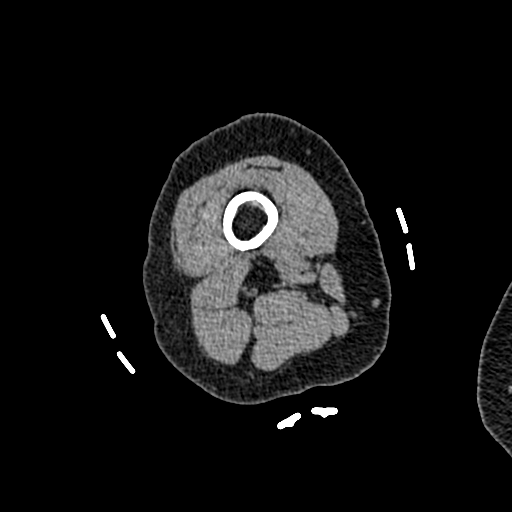
[im 170/183  soft-tissue]
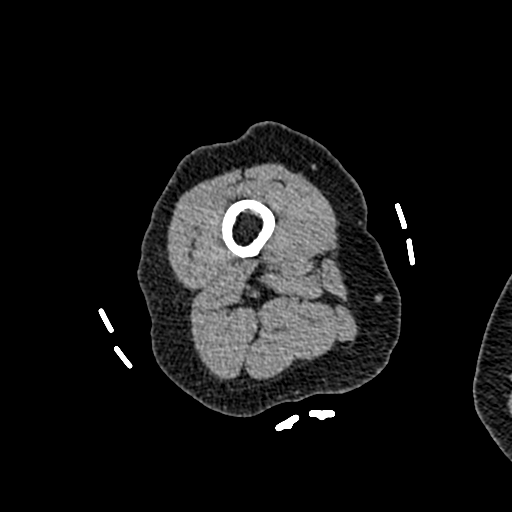

[Series 9: coronal st · coronal · 0.38mm/px · 3 of 130 slices shown]
[im 33/130  soft-tissue]
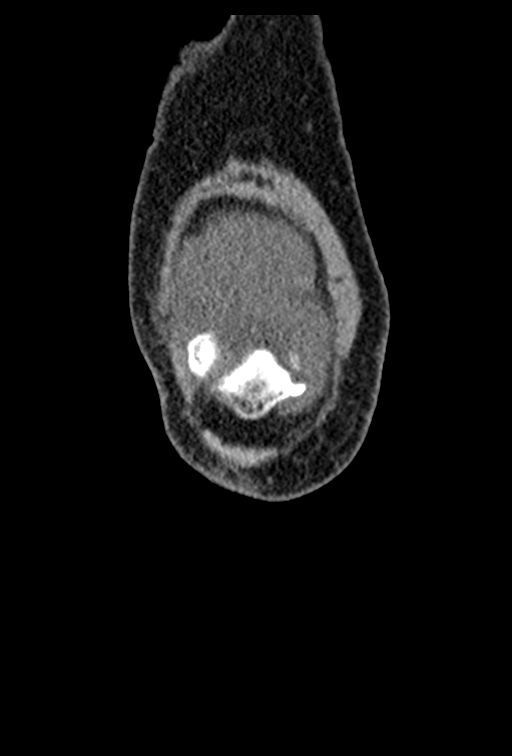
[im 65/130  soft-tissue]
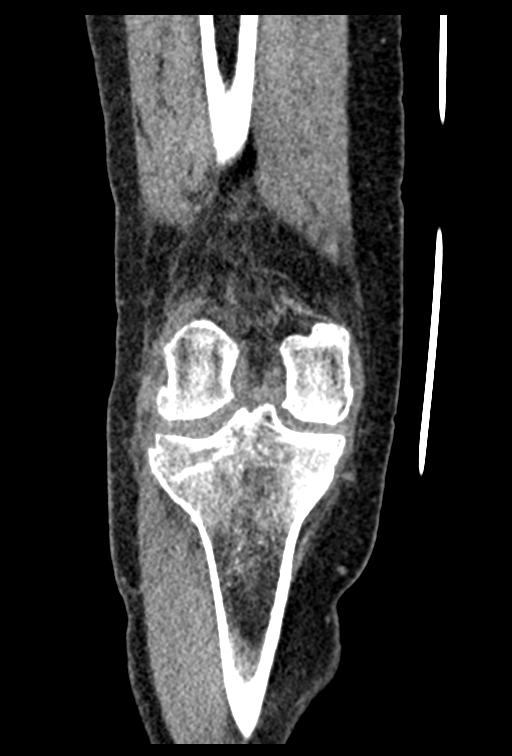
[im 97/130  soft-tissue]
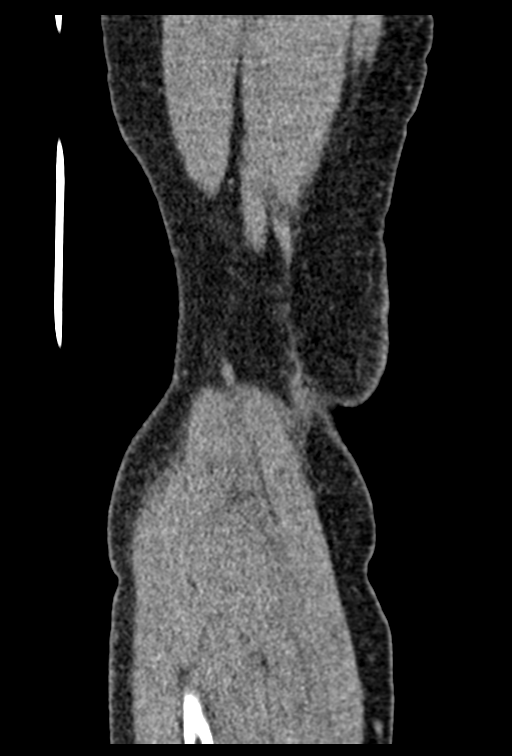

[15 of 46 positions shown; findings below may reference images not displayed]

FINDINGS: Bones/Joint/Cartilage

Generalized osteopenia. Comminuted fracture of the lateral tibial
plateau with extension to the articular surface and 4 mm of
depression of the posterior articular surface. Fracture extends
through the base of the tibial eminence. Nondisplaced fracture of
the medial tibial plateau involving the articular surface without
depression or displacement.

Large lipohemarthrosis.

No other acute fracture or dislocation. No aggressive osseous
lesion. Mild tricompartmental osteoarthritis of the right knee.

Ligaments

Ligaments are suboptimally evaluated by CT.

Muscles and Tendons
Muscles are normal. No muscle atrophy. No intramuscular fluid
collection or hematoma. Quadriceps tendon and patellar tendon are
intact.

Soft tissue
No fluid collection or hematoma.  No soft tissue mass.
IMPRESSION: 1. Comminuted fracture of the lateral tibial plateau with extension
to the articular surface and 4 mm of depression of the posterior
articular surface. Fracture extends through the base of the tibial
eminence. Nondisplaced fracture of the medial tibial plateau
involving the articular surface without depression or displacement.
2. Large lipohemarthrosis.

## 2022-07-19 ENCOUNTER — Ambulatory Visit: Payer: Medicare PPO | Admitting: Dermatology

## 2022-07-20 DIAGNOSIS — Z129 Encounter for screening for malignant neoplasm, site unspecified: Secondary | ICD-10-CM | POA: Diagnosis not present

## 2022-07-20 DIAGNOSIS — I781 Nevus, non-neoplastic: Secondary | ICD-10-CM | POA: Diagnosis not present

## 2022-07-20 DIAGNOSIS — D1801 Hemangioma of skin and subcutaneous tissue: Secondary | ICD-10-CM | POA: Diagnosis not present

## 2022-07-20 DIAGNOSIS — K130 Diseases of lips: Secondary | ICD-10-CM | POA: Diagnosis not present

## 2022-07-20 DIAGNOSIS — L570 Actinic keratosis: Secondary | ICD-10-CM | POA: Diagnosis not present

## 2022-11-09 ENCOUNTER — Other Ambulatory Visit: Payer: Self-pay | Admitting: Family Medicine

## 2022-11-09 DIAGNOSIS — Z1231 Encounter for screening mammogram for malignant neoplasm of breast: Secondary | ICD-10-CM

## 2022-12-15 DIAGNOSIS — Z01419 Encounter for gynecological examination (general) (routine) without abnormal findings: Secondary | ICD-10-CM | POA: Diagnosis not present

## 2022-12-15 DIAGNOSIS — Z1272 Encounter for screening for malignant neoplasm of vagina: Secondary | ICD-10-CM | POA: Diagnosis not present

## 2022-12-27 ENCOUNTER — Ambulatory Visit
Admission: RE | Admit: 2022-12-27 | Discharge: 2022-12-27 | Disposition: A | Discharge status: Home / Self Care | Payer: Medicare PPO | Source: Ambulatory Visit | Attending: Family Medicine | Admitting: Family Medicine

## 2022-12-27 DIAGNOSIS — Z1231 Encounter for screening mammogram for malignant neoplasm of breast: Secondary | ICD-10-CM

## 2023-01-10 ENCOUNTER — Other Ambulatory Visit: Payer: Self-pay

## 2023-01-10 ENCOUNTER — Emergency Department (HOSPITAL_BASED_OUTPATIENT_CLINIC_OR_DEPARTMENT_OTHER)
Admission: EM | Admit: 2023-01-10 | Discharge: 2023-01-11 | Disposition: A | Discharge status: Home / Self Care | Payer: Medicare PPO | Attending: Emergency Medicine | Admitting: Emergency Medicine

## 2023-01-10 ENCOUNTER — Encounter (HOSPITAL_BASED_OUTPATIENT_CLINIC_OR_DEPARTMENT_OTHER): Payer: Self-pay

## 2023-01-10 ENCOUNTER — Emergency Department (HOSPITAL_BASED_OUTPATIENT_CLINIC_OR_DEPARTMENT_OTHER): Payer: Medicare PPO

## 2023-01-10 DIAGNOSIS — N200 Calculus of kidney: Secondary | ICD-10-CM

## 2023-01-10 DIAGNOSIS — N132 Hydronephrosis with renal and ureteral calculous obstruction: Secondary | ICD-10-CM | POA: Diagnosis not present

## 2023-01-10 DIAGNOSIS — D72829 Elevated white blood cell count, unspecified: Secondary | ICD-10-CM | POA: Insufficient documentation

## 2023-01-10 DIAGNOSIS — N134 Hydroureter: Secondary | ICD-10-CM | POA: Diagnosis not present

## 2023-01-10 DIAGNOSIS — N131 Hydronephrosis with ureteral stricture, not elsewhere classified: Secondary | ICD-10-CM | POA: Diagnosis not present

## 2023-01-10 DIAGNOSIS — R1032 Left lower quadrant pain: Secondary | ICD-10-CM | POA: Diagnosis not present

## 2023-01-10 DIAGNOSIS — R109 Unspecified abdominal pain: Secondary | ICD-10-CM | POA: Diagnosis present

## 2023-01-10 LAB — URINALYSIS, ROUTINE W REFLEX MICROSCOPIC
Bilirubin Urine: NEGATIVE
Glucose, UA: NEGATIVE mg/dL
Ketones, ur: NEGATIVE mg/dL
Nitrite: NEGATIVE
Protein, ur: 30 mg/dL — AB
Specific Gravity, Urine: >=1.03 (ref 1.005–1.030)
pH: 5.5 (ref 5.0–8.0)

## 2023-01-10 LAB — COMPREHENSIVE METABOLIC PANEL
ALT: 17 U/L (ref 0–44)
AST: 22 U/L (ref 15–41)
Albumin: 4.2 g/dL (ref 3.5–5.0)
Alkaline Phosphatase: 76 U/L (ref 38–126)
Anion gap: 12 (ref 5–15)
BUN: 31 mg/dL — ABNORMAL HIGH (ref 8–23)
CO2: 28 mmol/L (ref 22–32)
Calcium: 9.2 mg/dL (ref 8.9–10.3)
Chloride: 99 mmol/L (ref 98–111)
Creatinine, Ser: 1.3 mg/dL — ABNORMAL HIGH (ref 0.44–1.00)
GFR, Estimated: 44 mL/min — ABNORMAL LOW (ref >60)
Glucose, Bld: 128 mg/dL — ABNORMAL HIGH (ref 70–99)
Potassium: 4 mmol/L (ref 3.5–5.1)
Sodium: 139 mmol/L (ref 135–145)
Total Bilirubin: 0.5 mg/dL (ref 0.3–1.2)
Total Protein: 7.8 g/dL (ref 6.5–8.1)

## 2023-01-10 LAB — CBC
HCT: 38.4 % (ref 36.0–46.0)
Hemoglobin: 12.4 g/dL (ref 12.0–15.0)
MCH: 29 pg (ref 26.0–34.0)
MCHC: 32.3 g/dL (ref 30.0–36.0)
MCV: 89.7 fL (ref 80.0–100.0)
Platelets: 250 10*3/uL (ref 150–400)
RBC: 4.28 MIL/uL (ref 3.87–5.11)
RDW: 13.8 % (ref 11.5–15.5)
WBC: 13.4 10*3/uL — ABNORMAL HIGH (ref 4.0–10.5)
nRBC: 0 % (ref 0.0–0.2)

## 2023-01-10 LAB — URINALYSIS, MICROSCOPIC (REFLEX): Bacteria, UA: NONE SEEN

## 2023-01-10 LAB — LIPASE, BLOOD: Lipase: 29 U/L (ref 11–51)

## 2023-01-10 MED ORDER — FENTANYL CITRATE PF 50 MCG/ML IJ SOSY
50.0000 ug | PREFILLED_SYRINGE | Freq: Once | INTRAMUSCULAR | Status: AC
  Administered 2023-01-10: 50 ug via INTRAVENOUS
  Filled 2023-01-10: qty 1

## 2023-01-10 MED ORDER — SODIUM CHLORIDE 0.9 % IV BOLUS
500.0000 mL | Freq: Once | INTRAVENOUS | Status: AC
  Administered 2023-01-10: 500 mL via INTRAVENOUS

## 2023-01-10 MED ORDER — ONDANSETRON HCL 4 MG/2ML IJ SOLN
4.0000 mg | Freq: Once | INTRAMUSCULAR | Status: AC | PRN
  Administered 2023-01-10: 4 mg via INTRAVENOUS
  Filled 2023-01-10: qty 2

## 2023-01-10 NOTE — ED Triage Notes (Signed)
Pt c/o abdominal pain after 7pm tonight Generalized abd, pain that goes around to left flank. No urinary symptoms N/V

## 2023-01-10 NOTE — ED Provider Notes (Signed)
[facility] Provider Note   CSN: [id number] Arrival date & time: 01/10/23  2220     History {Add pertinent medical, surgical, social history, OB history to HPI:1} Chief Complaint  Patient presents with   Abdominal Pain    Teresa Mcdonald is a 72 y.o. female.  The history is provided by the patient and the spouse.  Abdominal Pain Teresa Mcdonald is a 72 y.o. female who presents to the Emergency Department complaining of *** Svere AP At dinner at 7, fine.  Needed to have a BM a few times.  Vomited a few times.  Has lower abdominal pain and radiates to left low back.  Started at 8p.  Has pressure to urinate in abdomen.  No fever.  No medical problems.      Home Medications Prior to Admission medications   Medication Sig Start Date End Date Taking? Authorizing Provider  calcium gluconate 500 MG tablet Take 1,000 mg by mouth daily.    [provider]  estrogens, conjugated, (PREMARIN) 0.3 MG tablet Take 0.3 mg by mouth daily.    [provider]  Multiple Vitamin (MULTIVITAMIN WITH MINERALS) TABS tablet Take 1 tablet by mouth daily.    [provider]  naproxen sodium (ANAPROX) 220 MG tablet Take 220 mg by mouth 2 (two) times daily with a meal.    [provider]      Allergies    Patient has no known allergies.    Review of Systems   Review of Systems  Gastrointestinal:  Positive for abdominal pain.  All other systems reviewed and are negative.   Physical Exam Updated Vital Signs BP (!) 158/70 (BP Location: Left Arm)   Pulse 82   Temp 97.7 F (36.5 C)   Resp 20   Ht 5\' 1"  (1.549 m)   Wt 53.5 kg   SpO2 100%   BMI 22.30 kg/m  Physical Exam Vitals and nursing note reviewed.  Constitutional:      Appearance: She is well-developed.  HENT:     Head: Normocephalic and atraumatic.  Cardiovascular:     Rate and Rhythm: Normal rate and regular rhythm.  Pulmonary:     Effort: Pulmonary  effort is normal. No respiratory distress.  Abdominal:     Palpations: Abdomen is soft.     Tenderness: There is no abdominal tenderness. There is no guarding or rebound.  Musculoskeletal:        General: No tenderness.  Skin:    General: Skin is warm and dry.  Neurological:     Mental Status: She is alert and oriented to person, place, and time.  Psychiatric:        Behavior: Behavior normal.     ED Results / Procedures / Treatments   Labs (all labs ordered are listed, but only abnormal results are displayed) Labs Reviewed  CBC - Abnormal; Notable for the following components:      Result Value   WBC 13.4 (*)    All other components within normal limits  URINALYSIS, ROUTINE W REFLEX MICROSCOPIC - Abnormal; Notable for the following components:   APPearance HAZY (*)    Hgb urine dipstick MODERATE (*)    Protein, ur 30 (*)    Leukocytes,Ua TRACE (*)    All other components within normal limits  URINALYSIS, MICROSCOPIC (REFLEX)  LIPASE, BLOOD  COMPREHENSIVE METABOLIC PANEL    EKG None  Radiology No results found.  Procedures Procedures  {Document cardiac  monitor, telemetry assessment procedure when appropriate:1}  Medications Ordered in ED Medications  ondansetron (ZOFRAN) injection 4 mg (4 mg Intravenous Given 01/10/23 2250)    ED Course/ Medical Decision Making/ A&P   {   Click here for ABCD2, HEART and other calculatorsREFRESH Note before signing :1}                              Medical Decision Making Amount and/or Complexity of Data Reviewed Labs: ordered.  Risk Prescription drug management.   ***  {Document critical care time when appropriate:1} {Document review of labs and clinical decision tools ie heart score, Chads2Vasc2 etc:1}  {Document your independent review of radiology images, and any outside records:1} {Document your discussion with family members, caretakers, and with consultants:1} {Document social determinants of health affecting  pt's care:1} {Document your decision making why or why not admission, treatments were needed:1} Final Clinical Impression(s) / ED Diagnoses Final diagnoses:  None    Rx / DC Orders ED Discharge Orders     None

## 2023-01-11 MED ORDER — OXYCODONE-ACETAMINOPHEN 5-325 MG PO TABS: 1.0000 | ORAL_TABLET | Freq: Four times a day (QID) | ORAL | 0 refills | Status: AC | PRN

## 2023-01-11 MED ORDER — TAMSULOSIN HCL 0.4 MG PO CAPS: 0.4000 mg | ORAL_CAPSULE | Freq: Every day | ORAL | 0 refills | Status: AC

## 2023-01-11 MED ORDER — ONDANSETRON 4 MG PO TBDP: 4.0000 mg | ORAL_TABLET | Freq: Three times a day (TID) | ORAL | 0 refills | Status: AC | PRN

## 2023-01-11 MED ORDER — KETOROLAC TROMETHAMINE 15 MG/ML IJ SOLN
15.0000 mg | Freq: Once | INTRAMUSCULAR | Status: AC
  Administered 2023-01-11: 15 mg via INTRAVENOUS
  Filled 2023-01-11: qty 1

## 2023-01-11 NOTE — Discharge Instructions (Signed)
You have a 2mm stone in your left ureter.  Drink plenty of fluids.    Get rechecked immediately if you have fevers, cannot pee, have uncontrolled pain or new concerning symptoms.

## 2023-01-11 NOTE — ED Notes (Signed)
Urine strainer provided with a specimen container and education on straining her urine and follow up with urology.

## 2023-01-18 DIAGNOSIS — D2262 Melanocytic nevi of left upper limb, including shoulder: Secondary | ICD-10-CM | POA: Diagnosis not present

## 2023-01-18 DIAGNOSIS — L570 Actinic keratosis: Secondary | ICD-10-CM | POA: Diagnosis not present

## 2023-01-18 DIAGNOSIS — L249 Irritant contact dermatitis, unspecified cause: Secondary | ICD-10-CM | POA: Diagnosis not present

## 2023-01-19 DIAGNOSIS — Z23 Encounter for immunization: Secondary | ICD-10-CM | POA: Diagnosis not present

## 2023-01-27 DIAGNOSIS — N202 Calculus of kidney with calculus of ureter: Secondary | ICD-10-CM | POA: Diagnosis not present

## 2023-02-17 DIAGNOSIS — M85852 Other specified disorders of bone density and structure, left thigh: Secondary | ICD-10-CM | POA: Diagnosis not present

## 2023-02-17 DIAGNOSIS — Z780 Asymptomatic menopausal state: Secondary | ICD-10-CM | POA: Diagnosis not present

## 2023-03-30 DIAGNOSIS — M810 Age-related osteoporosis without current pathological fracture: Secondary | ICD-10-CM | POA: Diagnosis not present

## 2023-05-12 DIAGNOSIS — M8580 Other specified disorders of bone density and structure, unspecified site: Secondary | ICD-10-CM | POA: Diagnosis not present

## 2023-05-12 DIAGNOSIS — Z780 Asymptomatic menopausal state: Secondary | ICD-10-CM | POA: Diagnosis not present

## 2023-05-12 DIAGNOSIS — M810 Age-related osteoporosis without current pathological fracture: Secondary | ICD-10-CM | POA: Diagnosis not present

## 2023-05-24 DIAGNOSIS — Z780 Asymptomatic menopausal state: Secondary | ICD-10-CM | POA: Diagnosis not present

## 2023-05-24 DIAGNOSIS — M8580 Other specified disorders of bone density and structure, unspecified site: Secondary | ICD-10-CM | POA: Diagnosis not present

## 2023-05-31 DIAGNOSIS — Z0000 Encounter for general adult medical examination without abnormal findings: Secondary | ICD-10-CM | POA: Diagnosis not present

## 2023-05-31 DIAGNOSIS — Z136 Encounter for screening for cardiovascular disorders: Secondary | ICD-10-CM | POA: Diagnosis not present

## 2023-05-31 DIAGNOSIS — N200 Calculus of kidney: Secondary | ICD-10-CM | POA: Diagnosis not present

## 2023-05-31 DIAGNOSIS — Z1211 Encounter for screening for malignant neoplasm of colon: Secondary | ICD-10-CM | POA: Diagnosis not present

## 2023-05-31 DIAGNOSIS — M810 Age-related osteoporosis without current pathological fracture: Secondary | ICD-10-CM | POA: Diagnosis not present

## 2023-05-31 DIAGNOSIS — Z13220 Encounter for screening for lipoid disorders: Secondary | ICD-10-CM | POA: Diagnosis not present

## 2023-05-31 DIAGNOSIS — Z23 Encounter for immunization: Secondary | ICD-10-CM | POA: Diagnosis not present

## 2023-05-31 DIAGNOSIS — Z1231 Encounter for screening mammogram for malignant neoplasm of breast: Secondary | ICD-10-CM | POA: Diagnosis not present

## 2023-05-31 DIAGNOSIS — Z7185 Encounter for immunization safety counseling: Secondary | ICD-10-CM | POA: Diagnosis not present

## 2023-07-19 DIAGNOSIS — L918 Other hypertrophic disorders of the skin: Secondary | ICD-10-CM | POA: Diagnosis not present

## 2023-07-19 DIAGNOSIS — D239 Other benign neoplasm of skin, unspecified: Secondary | ICD-10-CM | POA: Diagnosis not present

## 2023-07-19 DIAGNOSIS — L820 Inflamed seborrheic keratosis: Secondary | ICD-10-CM | POA: Diagnosis not present

## 2023-07-19 DIAGNOSIS — Z129 Encounter for screening for malignant neoplasm, site unspecified: Secondary | ICD-10-CM | POA: Diagnosis not present

## 2023-07-19 DIAGNOSIS — D1801 Hemangioma of skin and subcutaneous tissue: Secondary | ICD-10-CM | POA: Diagnosis not present

## 2023-07-27 DIAGNOSIS — J069 Acute upper respiratory infection, unspecified: Secondary | ICD-10-CM | POA: Diagnosis not present

## 2023-10-11 DIAGNOSIS — L249 Irritant contact dermatitis, unspecified cause: Secondary | ICD-10-CM | POA: Diagnosis not present

## 2023-10-28 ENCOUNTER — Encounter: Payer: Self-pay | Admitting: Advanced Practice Midwife

## 2023-12-15 ENCOUNTER — Other Ambulatory Visit: Payer: Self-pay | Admitting: Obstetrics and Gynecology

## 2023-12-15 DIAGNOSIS — Z1231 Encounter for screening mammogram for malignant neoplasm of breast: Secondary | ICD-10-CM

## 2023-12-28 ENCOUNTER — Ambulatory Visit
Admission: RE | Admit: 2023-12-28 | Discharge: 2023-12-28 | Disposition: A | Discharge status: Home / Self Care | Source: Ambulatory Visit | Attending: Obstetrics and Gynecology | Admitting: Obstetrics and Gynecology

## 2023-12-28 DIAGNOSIS — Z1231 Encounter for screening mammogram for malignant neoplasm of breast: Secondary | ICD-10-CM

## 2024-01-13 DIAGNOSIS — M8580 Other specified disorders of bone density and structure, unspecified site: Secondary | ICD-10-CM | POA: Diagnosis not present

## 2024-01-13 DIAGNOSIS — M810 Age-related osteoporosis without current pathological fracture: Secondary | ICD-10-CM | POA: Diagnosis not present

## 2024-01-13 DIAGNOSIS — Z780 Asymptomatic menopausal state: Secondary | ICD-10-CM | POA: Diagnosis not present

## 2024-01-24 DIAGNOSIS — Z23 Encounter for immunization: Secondary | ICD-10-CM | POA: Diagnosis not present

## 2024-02-21 DIAGNOSIS — R8290 Unspecified abnormal findings in urine: Secondary | ICD-10-CM | POA: Diagnosis not present

## 2024-02-21 DIAGNOSIS — R300 Dysuria: Secondary | ICD-10-CM | POA: Diagnosis not present

## 2024-02-21 DIAGNOSIS — Z6821 Body mass index (BMI) 21.0-21.9, adult: Secondary | ICD-10-CM | POA: Diagnosis not present
